# Patient Record
Sex: Female | Born: 1950 | Race: White | Hispanic: No | Marital: Married | Smoking: Never smoker
Health system: Southern US, Community
[De-identification: ages and names within clinical notes are randomized; demographics above are authoritative.]

## PROBLEM LIST (undated history)

## (undated) ENCOUNTER — Inpatient Hospital Stay: Admission: EM | Payer: Self-pay | Source: Home / Self Care

## (undated) DIAGNOSIS — E785 Hyperlipidemia, unspecified: Secondary | ICD-10-CM

## (undated) DIAGNOSIS — I10 Essential (primary) hypertension: Secondary | ICD-10-CM

## (undated) DIAGNOSIS — G8929 Other chronic pain: Secondary | ICD-10-CM

## (undated) HISTORY — PX: SHOULDER ARTHROSCOPY: SHX128

---

## 2002-05-02 ENCOUNTER — Encounter: Admission: RE | Admit: 2002-05-02 | Discharge: 2002-05-13 | Payer: Self-pay | Admitting: Orthopedic Surgery

## 2002-07-04 ENCOUNTER — Encounter: Admission: RE | Admit: 2002-07-04 | Discharge: 2002-10-02 | Payer: Self-pay | Admitting: Orthopedic Surgery

## 2002-10-03 ENCOUNTER — Encounter: Admission: RE | Admit: 2002-10-03 | Discharge: 2002-10-13 | Payer: Self-pay | Admitting: Orthopedic Surgery

## 2002-11-09 ENCOUNTER — Encounter: Payer: Self-pay | Admitting: Emergency Medicine

## 2002-11-09 ENCOUNTER — Emergency Department (HOSPITAL_COMMUNITY): Admission: EM | Admit: 2002-11-09 | Discharge: 2002-11-09 | Payer: Self-pay | Admitting: Emergency Medicine

## 2002-11-10 ENCOUNTER — Ambulatory Visit (HOSPITAL_COMMUNITY): Admission: RE | Admit: 2002-11-10 | Discharge: 2002-11-10 | Payer: Self-pay | Admitting: Unknown Physician Specialty

## 2002-11-10 ENCOUNTER — Encounter: Payer: Self-pay | Admitting: Unknown Physician Specialty

## 2002-12-16 ENCOUNTER — Other Ambulatory Visit: Admission: RE | Admit: 2002-12-16 | Discharge: 2002-12-16 | Payer: Self-pay | Admitting: *Deleted

## 2004-05-17 ENCOUNTER — Ambulatory Visit: Payer: Self-pay | Admitting: Family Medicine

## 2004-12-04 ENCOUNTER — Ambulatory Visit: Payer: Self-pay | Admitting: Family Medicine

## 2004-12-05 ENCOUNTER — Ambulatory Visit (HOSPITAL_COMMUNITY): Admission: RE | Admit: 2004-12-05 | Discharge: 2004-12-05 | Payer: Self-pay | Admitting: Family Medicine

## 2005-05-07 ENCOUNTER — Ambulatory Visit (HOSPITAL_COMMUNITY): Admission: RE | Admit: 2005-05-07 | Discharge: 2005-05-07 | Payer: Self-pay | Admitting: Family Medicine

## 2005-05-19 ENCOUNTER — Ambulatory Visit: Payer: Self-pay | Admitting: Family Medicine

## 2005-07-07 ENCOUNTER — Ambulatory Visit: Payer: Self-pay | Admitting: Family Medicine

## 2005-07-14 ENCOUNTER — Ambulatory Visit: Payer: Self-pay | Admitting: Family Medicine

## 2005-09-19 ENCOUNTER — Ambulatory Visit: Payer: Self-pay | Admitting: Family Medicine

## 2005-09-19 ENCOUNTER — Inpatient Hospital Stay (HOSPITAL_COMMUNITY): Admission: EM | Admit: 2005-09-19 | Discharge: 2005-09-20 | Payer: Self-pay | Admitting: Emergency Medicine

## 2005-11-06 ENCOUNTER — Ambulatory Visit: Payer: Self-pay | Admitting: Family Medicine

## 2006-02-20 ENCOUNTER — Emergency Department (HOSPITAL_COMMUNITY): Admission: EM | Admit: 2006-02-20 | Discharge: 2006-02-20 | Payer: Self-pay | Admitting: Emergency Medicine

## 2006-03-04 ENCOUNTER — Ambulatory Visit: Payer: Self-pay | Admitting: Family Medicine

## 2006-03-04 ENCOUNTER — Ambulatory Visit (HOSPITAL_COMMUNITY): Admission: RE | Admit: 2006-03-04 | Discharge: 2006-03-04 | Payer: Self-pay | Admitting: Family Medicine

## 2006-06-23 ENCOUNTER — Ambulatory Visit: Payer: Self-pay | Admitting: Family Medicine

## 2006-06-26 ENCOUNTER — Ambulatory Visit (HOSPITAL_COMMUNITY): Admission: RE | Admit: 2006-06-26 | Discharge: 2006-06-26 | Payer: Self-pay | Admitting: Family Medicine

## 2006-08-12 ENCOUNTER — Ambulatory Visit: Payer: Self-pay | Admitting: Family Medicine

## 2007-07-21 ENCOUNTER — Ambulatory Visit (HOSPITAL_COMMUNITY): Admission: RE | Admit: 2007-07-21 | Discharge: 2007-07-21 | Payer: Self-pay | Admitting: Family Medicine

## 2008-09-22 ENCOUNTER — Ambulatory Visit (HOSPITAL_COMMUNITY): Admission: RE | Admit: 2008-09-22 | Discharge: 2008-09-22 | Payer: Self-pay | Admitting: Family Medicine

## 2010-04-08 ENCOUNTER — Ambulatory Visit (HOSPITAL_COMMUNITY)
Admission: RE | Admit: 2010-04-08 | Discharge: 2010-04-08 | Payer: Self-pay | Source: Home / Self Care | Admitting: Family Medicine

## 2010-06-02 ENCOUNTER — Encounter: Payer: Self-pay | Admitting: Family Medicine

## 2010-09-27 NOTE — H&P (Signed)
[name], [name]               ACCOUNT NO.:  [id number]   MEDICAL RECORD NO.:  [id number]          PATIENT TYPE:  INP   LOCATION:  A323                          FACILITY:  [facility]   PHYSICIAN:  Margaretmary Dys, M.D.DATE OF BIRTH:  1951/05/09   DATE OF ADMISSION:  09/19/2005  DATE OF DISCHARGE:  05/12/2007LH                                HISTORY & PHYSICAL   ADMISSION DIAGNOSES:  1.  Acute abdominal pain.  2.  Nausea and vomiting.  3.  Dehydration.  4.  Small bowel obstruction with probable early partial small bowel      obstruction.   CHIEF COMPLAINT:  1.  Vomiting.  2.  Abdominal pain.   HISTORY OF PRESENT ILLNESS:  [name] is a 60 year old Caucasian female  who presents with complaint of vomiting.   The patient stated she has had vomiting over the past 2-3 days now.  The  onset had been gradual.  She denies any sick contacts.  She subsequently  developed diffuse abdominal pain, which she rated as 10 out of 10.  The pain  is currently alike and appeared to improve with bowel movements.  The  patient's husband said that two or three other people were sick with the  same symptoms but did not appear to be clearly associated with her.   Evaluation in the emergency room revealed she was mildly dehydrated, and she  also had some distended air bowel loops on x-rays obtained.  The plan is to  admit her for observation overnight.  It appears that this might be entirely  an acute viral gastroenteritis with unlikely subsequent indication for  anything at this time.  The patient said she feels a little bit better ever  since getting IV fluids and pain medication in the emergency room.  She  denies any fever or chills.  No frequency or urgency.  No headaches,  dizziness or lightheadedness.   REVIEW OF SYSTEMS:  A 10-point review of systems is otherwise negative  except as mentioned in the history of present illness.   PAST MEDICAL HISTORY:  1.  Hypertension.  2.   Dyslipidemia.   MEDICATIONS:  1.  She is on Lipitor 20 mg p.o. once a day.  2.  Another blood pressure medication, but she does not remember the name.   ALLERGIES:  She has no known drug allergies.   SOCIAL HISTORY:  The patient is a lifelong nonsmoker.  Does not drink  alcohol.  Denies any drug abuse.  She is married.  Her husband accompanied  her on this visit.   FAMILY HISTORY:  Positive for hypertension, coronary artery disease, but  otherwise noncontributory.   PHYSICAL EXAMINATION:  GENERAL APPEARANCE:  Conscious, alert, comfortable,  not in acute distress, pleasant.  VITAL SIGNS:  Blood pressure on arrival in the emergency room was 136/72,  pulse 67, respirations 18, temperature 97 degrees Fahrenheit.  HEENT:  Normocephalic, atraumatic.  Oral mucosa was moist with no exudates.  NECK:  Supple.  No JVD or lymphadenopathy.  LUNGS:  Clear clinically with good air entry bilaterally.  HEART:  S1, S2, regular.  No S3, S4, gallops or rubs.  ABDOMEN:  Obese, soft, not tender and not distended.  Bowel sounds were  hypoactive.  No masses palpable.  EXTREMITIES:  No edema.  CNS:  Exam was grossly intact with no focal deficits.   LABORATORY/DIAGNOSTIC DATA:  White blood cell count was 7, hemoglobin 13.4,  hematocrit 39.4, platelet count 248 with no left shift.  Sodium 139,  potassium 3.2, chloride 104, CO2 27, glucose 100, BUN 10, creatinine 0.6,  AST 29, ALT 24, calcium 9.  Urinalysis was negative.   Abdominal x-rays showed distended loops of small bowel with probable early  partial SBO.   ASSESSMENT/PLAN:  This is a 60 year old Caucasian female who presented to  the emergency room with nausea, vomiting and abdominal pain.  Symptoms are  fairly consistent with an acute viral illness.  Her abdominal x-rays reveal  some evidence of partial small bowel obstruction, which may be consistent  with her viral gastroenteritis too.  However, the patient needs to be  admitted and observed to  ensure that the small bowel obstruction is not  progressive.  We will keep her n.p.o. for now but may have ice chips, and we  will hydrate her.  We will correct her electrolyte abnormalities.   We will treat her with Zofran p.r.n. for nausea and vomiting.  We will hold  her home medications at this time.   We will review tomorrow and depending on if her symptoms are persistent and  she develops increasing abdominal distention, we will get  repeat abdominal  x-rays.  If symptoms improve, we will possibly discharge her later tomorrow.   I discussed the above plan with her, and she verbalized full understanding.      Margaretmary Dys, M.D.  Electronically Signed     AM/MEDQ  D:  09/20/2005  T:  09/20/2005  Job:  [id number]

## 2010-09-27 NOTE — Discharge Summary (Signed)
NAMEGESENIA, BANTZ               ACCOUNT NO.:  [id number]   MEDICAL RECORD NO.:  [id number]          PATIENT TYPE:  INP   LOCATION:  A323                          FACILITY:  [facility]   PHYSICIAN:  Margaretmary Dys, M.D.DATE OF BIRTH:  August 21, 1950   DATE OF ADMISSION:  09/19/2005  DATE OF DISCHARGE:  05/12/2007LH                                 DISCHARGE SUMMARY   DISCHARGE DIAGNOSES:  1.  Acute gastroenteritis.  2.  Dehydration.   DISCHARGE MEDICATIONS:  The patient is to resume all home medications as  previous.   DIET:  Regular diet.   ACTIVITY:  As tolerated.   FOLLOW-UP:  With Dr. Lysbeth Galas as needed.   SPECIAL INSTRUCTIONS:  I have asked her to return to the ED if nausea or  vomiting becomes persistent.   HOSPITAL COURSE:  Ms. Pesch is a 60 year old Caucasian female who presents  with complaint of vomiting and some abdominal pain.  She started vomiting  about two to three days prior to admission.  Evaluation in the emergency  room revealed she was mildly dehydrated, and she also had some distended  bowel loops on x-rays.  Plan was to observe her overnight.  During the  course of observation, she was kept n.p.o.  When I saw her in the morning,  she was doing much better without any significant abdominal distention or  absent bowel sounds.   She did very well and was subsequently discharged on the next day, as her  nausea, vomiting, and diarrhea had significantly abated.   DISPOSITION:  Discharged home in satisfactory condition.      Margaretmary Dys, M.D.  Electronically Signed     AM/MEDQ  D:  10/26/2005  T:  10/26/2005  Job:  [id number]

## 2011-06-30 ENCOUNTER — Other Ambulatory Visit (HOSPITAL_COMMUNITY): Payer: Self-pay | Admitting: Family Medicine

## 2011-06-30 DIAGNOSIS — Z139 Encounter for screening, unspecified: Secondary | ICD-10-CM

## 2011-07-07 ENCOUNTER — Ambulatory Visit (HOSPITAL_COMMUNITY)
Admission: RE | Admit: 2011-07-07 | Discharge: 2011-07-07 | Disposition: A | Discharge status: Home / Self Care | Payer: BC Managed Care – PPO | Source: Ambulatory Visit | Attending: Family Medicine | Admitting: Family Medicine

## 2011-07-07 DIAGNOSIS — Z1231 Encounter for screening mammogram for malignant neoplasm of breast: Secondary | ICD-10-CM | POA: Insufficient documentation

## 2011-07-07 DIAGNOSIS — Z139 Encounter for screening, unspecified: Secondary | ICD-10-CM

## 2013-03-03 ENCOUNTER — Other Ambulatory Visit (HOSPITAL_COMMUNITY): Payer: Self-pay | Admitting: Family Medicine

## 2013-03-03 DIAGNOSIS — R609 Edema, unspecified: Secondary | ICD-10-CM

## 2013-03-04 ENCOUNTER — Ambulatory Visit (HOSPITAL_COMMUNITY)
Admit: 2013-03-04 | Discharge: 2013-03-04 | Disposition: A | Discharge status: Home / Self Care | Payer: BC Managed Care – PPO | Source: Ambulatory Visit | Attending: Family Medicine | Admitting: Family Medicine

## 2013-03-04 ENCOUNTER — Ambulatory Visit (HOSPITAL_COMMUNITY): Payer: BC Managed Care – PPO

## 2013-03-04 DIAGNOSIS — R609 Edema, unspecified: Secondary | ICD-10-CM

## 2013-03-04 DIAGNOSIS — M79609 Pain in unspecified limb: Secondary | ICD-10-CM | POA: Insufficient documentation

## 2013-03-04 DIAGNOSIS — M7989 Other specified soft tissue disorders: Secondary | ICD-10-CM | POA: Insufficient documentation

## 2014-03-13 ENCOUNTER — Encounter (HOSPITAL_COMMUNITY): Payer: Self-pay | Admitting: *Deleted

## 2014-03-14 ENCOUNTER — Encounter (HOSPITAL_COMMUNITY): Payer: Self-pay | Admitting: *Deleted

## 2014-03-14 ENCOUNTER — Encounter (HOSPITAL_COMMUNITY)
Admission: RE | Disposition: A | Discharge status: Home / Self Care | Payer: Self-pay | Source: Ambulatory Visit | Attending: Orthopedic Surgery

## 2014-03-14 ENCOUNTER — Ambulatory Visit (HOSPITAL_COMMUNITY): Payer: Worker's Compensation | Admitting: Certified Registered"

## 2014-03-14 ENCOUNTER — Observation Stay (HOSPITAL_COMMUNITY)
Admission: RE | Admit: 2014-03-14 | Discharge: 2014-03-16 | Disposition: A | Discharge status: Home / Self Care | Payer: Worker's Compensation | Source: Ambulatory Visit | Attending: Orthopedic Surgery | Admitting: Orthopedic Surgery

## 2014-03-14 DIAGNOSIS — S52509A Unspecified fracture of the lower end of unspecified radius, initial encounter for closed fracture: Secondary | ICD-10-CM | POA: Diagnosis present

## 2014-03-14 DIAGNOSIS — I10 Essential (primary) hypertension: Secondary | ICD-10-CM | POA: Diagnosis not present

## 2014-03-14 DIAGNOSIS — S52571A Other intraarticular fracture of lower end of right radius, initial encounter for closed fracture: Principal | ICD-10-CM | POA: Insufficient documentation

## 2014-03-14 DIAGNOSIS — Z79891 Long term (current) use of opiate analgesic: Secondary | ICD-10-CM | POA: Diagnosis not present

## 2014-03-14 DIAGNOSIS — E785 Hyperlipidemia, unspecified: Secondary | ICD-10-CM | POA: Diagnosis not present

## 2014-03-14 DIAGNOSIS — X58XXXA Exposure to other specified factors, initial encounter: Secondary | ICD-10-CM | POA: Diagnosis not present

## 2014-03-14 DIAGNOSIS — Z79899 Other long term (current) drug therapy: Secondary | ICD-10-CM | POA: Diagnosis not present

## 2014-03-14 HISTORY — PX: ORIF WRIST FRACTURE: SHX2133

## 2014-03-14 HISTORY — DX: Essential (primary) hypertension: I10

## 2014-03-14 HISTORY — DX: Hyperlipidemia, unspecified: E78.5

## 2014-03-14 LAB — CBC
HEMATOCRIT: 38.7 % (ref 36.0–46.0)
Hemoglobin: 13.1 g/dL (ref 12.0–15.0)
MCH: 27.5 pg (ref 26.0–34.0)
MCHC: 33.9 g/dL (ref 30.0–36.0)
MCV: 81.1 fL (ref 78.0–100.0)
Platelets: 86 10*3/uL — ABNORMAL LOW (ref 150–400)
RBC: 4.77 MIL/uL (ref 3.87–5.11)
RDW: 15.1 % (ref 11.5–15.5)
WBC: 4.5 10*3/uL (ref 4.0–10.5)

## 2014-03-14 LAB — BASIC METABOLIC PANEL
Anion gap: 12 (ref 5–15)
BUN: 10 mg/dL (ref 6–23)
CALCIUM: 9 mg/dL (ref 8.4–10.5)
CO2: 26 meq/L (ref 19–32)
CREATININE: 0.43 mg/dL — AB (ref 0.50–1.10)
Chloride: 103 mEq/L (ref 96–112)
GFR calc Af Amer: >90 mL/min (ref >90)
GFR calc non Af Amer: >90 mL/min (ref >90)
GLUCOSE: 97 mg/dL (ref 70–99)
Potassium: 3.8 mEq/L (ref 3.7–5.3)
Sodium: 141 mEq/L (ref 137–147)

## 2014-03-14 SURGERY — OPEN REDUCTION INTERNAL FIXATION (ORIF) WRIST FRACTURE
Anesthesia: General | Site: Wrist | Laterality: Right

## 2014-03-14 MED ORDER — MIDAZOLAM HCL 2 MG/2ML IJ SOLN
INTRAMUSCULAR | Status: AC
  Filled 2014-03-14: qty 2

## 2014-03-14 MED ORDER — CEFAZOLIN SODIUM-DEXTROSE 2-3 GM-% IV SOLR
INTRAVENOUS | Status: AC
  Filled 2014-03-14: qty 50

## 2014-03-14 MED ORDER — ONDANSETRON HCL 4 MG/2ML IJ SOLN
INTRAMUSCULAR | Status: AC
  Filled 2014-03-14: qty 2

## 2014-03-14 MED ORDER — SUFENTANIL CITRATE 50 MCG/ML IV SOLN
INTRAVENOUS | Status: AC
  Filled 2014-03-14: qty 1

## 2014-03-14 MED ORDER — PROPOFOL 10 MG/ML IV BOLUS
INTRAVENOUS | Status: AC
  Filled 2014-03-14: qty 20

## 2014-03-14 MED ORDER — OXYCODONE HCL 5 MG/5ML PO SOLN: 5.0000 mg | Freq: Once | ORAL | Status: DC | PRN

## 2014-03-14 MED ORDER — DEXAMETHASONE SODIUM PHOSPHATE 4 MG/ML IJ SOLN
INTRAMUSCULAR | Status: DC | PRN
  Administered 2014-03-14: 4 mg via INTRAVENOUS

## 2014-03-14 MED ORDER — HYDROMORPHONE HCL 1 MG/ML IJ SOLN: 0.2500 mg | INTRAMUSCULAR | Status: DC | PRN

## 2014-03-14 MED ORDER — MIDAZOLAM HCL 2 MG/2ML IJ SOLN
INTRAMUSCULAR | Status: DC | PRN
  Administered 2014-03-14 (×2): 1 mg via INTRAVENOUS

## 2014-03-14 MED ORDER — LIDOCAINE HCL (CARDIAC) 20 MG/ML IV SOLN
INTRAVENOUS | Status: AC
  Filled 2014-03-14: qty 5

## 2014-03-14 MED ORDER — MEPERIDINE HCL 25 MG/ML IJ SOLN: 6.2500 mg | INTRAMUSCULAR | Status: DC | PRN

## 2014-03-14 MED ORDER — SODIUM CHLORIDE 0.9 % IJ SOLN
INTRAMUSCULAR | Status: AC
  Filled 2014-03-14: qty 10

## 2014-03-14 MED ORDER — CEFAZOLIN SODIUM-DEXTROSE 2-3 GM-% IV SOLR
INTRAVENOUS | Status: DC | PRN
  Administered 2014-03-14: 2 g via INTRAVENOUS

## 2014-03-14 MED ORDER — ONDANSETRON HCL 4 MG/2ML IJ SOLN: 4.0000 mg | Freq: Once | INTRAMUSCULAR | Status: DC | PRN

## 2014-03-14 MED ORDER — FENTANYL CITRATE 0.05 MG/ML IJ SOLN
INTRAMUSCULAR | Status: AC
  Filled 2014-03-14: qty 5

## 2014-03-14 MED ORDER — BUPIVACAINE-EPINEPHRINE (PF) 0.5% -1:200000 IJ SOLN
INTRAMUSCULAR | Status: DC | PRN
  Administered 2014-03-14: 30 mL via PERINEURAL

## 2014-03-14 MED ORDER — 0.9 % SODIUM CHLORIDE (POUR BTL) OPTIME
TOPICAL | Status: DC | PRN
  Administered 2014-03-14: 1000 mL

## 2014-03-14 MED ORDER — LACTATED RINGERS IV SOLN
INTRAVENOUS | Status: DC
  Administered 2014-03-14 (×2): via INTRAVENOUS

## 2014-03-14 MED ORDER — ONDANSETRON HCL 4 MG/2ML IJ SOLN
INTRAMUSCULAR | Status: DC | PRN
  Administered 2014-03-14: 4 mg via INTRAVENOUS

## 2014-03-14 MED ORDER — OXYCODONE HCL 5 MG PO TABS: 5.0000 mg | ORAL_TABLET | Freq: Once | ORAL | Status: DC | PRN

## 2014-03-14 MED ORDER — SUFENTANIL CITRATE 50 MCG/ML IV SOLN
INTRAVENOUS | Status: DC | PRN
  Administered 2014-03-14: 10 ug via INTRAVENOUS

## 2014-03-14 MED ORDER — LIDOCAINE HCL (CARDIAC) 20 MG/ML IV SOLN
INTRAVENOUS | Status: DC | PRN
  Administered 2014-03-14: 50 mg via INTRAVENOUS

## 2014-03-14 MED ORDER — FENTANYL CITRATE 0.05 MG/ML IJ SOLN
INTRAMUSCULAR | Status: DC | PRN
  Administered 2014-03-14 (×2): 50 ug via INTRAVENOUS

## 2014-03-14 MED ORDER — FENTANYL CITRATE 0.05 MG/ML IJ SOLN
INTRAMUSCULAR | Status: AC
  Filled 2014-03-14: qty 2

## 2014-03-14 MED ORDER — DEXAMETHASONE SODIUM PHOSPHATE 4 MG/ML IJ SOLN
INTRAMUSCULAR | Status: AC
  Filled 2014-03-14: qty 1

## 2014-03-14 MED ORDER — PROPOFOL INFUSION 10 MG/ML OPTIME
INTRAVENOUS | Status: DC | PRN
  Administered 2014-03-14: 50 ug/kg/min via INTRAVENOUS

## 2014-03-14 SURGICAL SUPPLY — 60 items
BANDAGE ELASTIC 3 VELCRO ST LF (GAUZE/BANDAGES/DRESSINGS) ×3 IMPLANT
BANDAGE ELASTIC 4 VELCRO ST LF (GAUZE/BANDAGES/DRESSINGS) ×3 IMPLANT
BLADE SURG ROTATE 9660 (MISCELLANEOUS) IMPLANT
BNDG CMPR 9X4 STRL LF SNTH (GAUZE/BANDAGES/DRESSINGS) ×1
BNDG CONFORM 2 STRL LF (GAUZE/BANDAGES/DRESSINGS) ×2 IMPLANT
BNDG CONFORM 3 STRL LF (GAUZE/BANDAGES/DRESSINGS) ×2 IMPLANT
BNDG ESMARK 4X9 LF (GAUZE/BANDAGES/DRESSINGS) ×3 IMPLANT
BNDG GAUZE ELAST 4 BULKY (GAUZE/BANDAGES/DRESSINGS) ×5 IMPLANT
CORDS BIPOLAR (ELECTRODE) ×3 IMPLANT
COVER SURGICAL LIGHT HANDLE (MISCELLANEOUS) ×3 IMPLANT
CUFF TOURNIQUET SINGLE 18IN (TOURNIQUET CUFF) ×3 IMPLANT
CUFF TOURNIQUET SINGLE 24IN (TOURNIQUET CUFF) IMPLANT
DRAIN TLS ROUND 10FR (DRAIN) IMPLANT
DRAPE OEC MINIVIEW 54X84 (DRAPES) IMPLANT
DRAPE SURG 17X23 STRL (DRAPES) ×3 IMPLANT
DRSG EMULSION OIL 3X3 NADH (GAUZE/BANDAGES/DRESSINGS) ×2 IMPLANT
GAUZE SPONGE 4X4 12PLY STRL (GAUZE/BANDAGES/DRESSINGS) ×3 IMPLANT
GAUZE XEROFORM 1X8 LF (GAUZE/BANDAGES/DRESSINGS) ×3 IMPLANT
GLOVE BIOGEL M STRL SZ7.5 (GLOVE) ×3 IMPLANT
GLOVE SS BIOGEL STRL SZ 8 (GLOVE) ×1 IMPLANT
GLOVE SUPERSENSE BIOGEL SZ 8 (GLOVE) ×2
GOWN STRL REUS W/ TWL LRG LVL3 (GOWN DISPOSABLE) ×3 IMPLANT
GOWN STRL REUS W/ TWL XL LVL3 (GOWN DISPOSABLE) ×3 IMPLANT
GOWN STRL REUS W/TWL LRG LVL3 (GOWN DISPOSABLE) ×9
GOWN STRL REUS W/TWL XL LVL3 (GOWN DISPOSABLE) ×9
KIT BASIN OR (CUSTOM PROCEDURE TRAY) ×3 IMPLANT
KIT ROOM TURNOVER OR (KITS) ×3 IMPLANT
LOOP VESSEL MAXI BLUE (MISCELLANEOUS) IMPLANT
MANIFOLD NEPTUNE II (INSTRUMENTS) ×3 IMPLANT
NEEDLE 22X1 1/2 (OR ONLY) (NEEDLE) IMPLANT
NS IRRIG 1000ML POUR BTL (IV SOLUTION) ×3 IMPLANT
PACK ORTHO EXTREMITY (CUSTOM PROCEDURE TRAY) ×3 IMPLANT
PAD ARMBOARD 7.5X6 YLW CONV (MISCELLANEOUS) ×6 IMPLANT
PAD CAST 3X4 CTTN HI CHSV (CAST SUPPLIES) ×1 IMPLANT
PAD CAST 4YDX4 CTTN HI CHSV (CAST SUPPLIES) ×1 IMPLANT
PADDING CAST ABS 3INX4YD NS (CAST SUPPLIES) ×2
PADDING CAST ABS 4INX4YD NS (CAST SUPPLIES) ×2
PADDING CAST ABS COTTON 3X4 (CAST SUPPLIES) IMPLANT
PADDING CAST ABS COTTON 4X4 ST (CAST SUPPLIES) IMPLANT
PADDING CAST COTTON 3X4 STRL (CAST SUPPLIES) ×3
PADDING CAST COTTON 4X4 STRL (CAST SUPPLIES) ×3
PEG LOCKING SMOOTH 2.2X18 (Peg) ×4 IMPLANT
PEG LOCKING SMOOTH 2.2X20 (Screw) ×4 IMPLANT
PEG LOCKING SMOOTH 2.2X22 (Screw) ×4 IMPLANT
PLATE NARROW DVR RIGHT (Plate) ×2 IMPLANT
SCREW LOCKING 2.7X13MM (Screw) ×2 IMPLANT
SCREW LOCKING 2.7X15MM (Screw) ×6 IMPLANT
SPLINT FIBERGLASS 4X30 (CAST SUPPLIES) ×2 IMPLANT
SPONGE LAP 4X18 X RAY DECT (DISPOSABLE) IMPLANT
SUT MNCRL AB 4-0 PS2 18 (SUTURE) ×3 IMPLANT
SUT PROLENE 3 0 PS 2 (SUTURE) IMPLANT
SUT VIC AB 3-0 FS2 27 (SUTURE) IMPLANT
SYR CONTROL 10ML LL (SYRINGE) IMPLANT
SYSTEM CHEST DRAIN TLS 7FR (DRAIN) IMPLANT
TOWEL OR 17X24 6PK STRL BLUE (TOWEL DISPOSABLE) ×3 IMPLANT
TOWEL OR 17X26 10 PK STRL BLUE (TOWEL DISPOSABLE) ×3 IMPLANT
TUBE CONNECTING 12'X1/4 (SUCTIONS) ×1
TUBE CONNECTING 12X1/4 (SUCTIONS) ×2 IMPLANT
TUBE EVACUATION TLS (MISCELLANEOUS) ×3 IMPLANT
WATER STERILE IRR 1000ML POUR (IV SOLUTION) ×3 IMPLANT

## 2014-03-14 NOTE — H&P (Signed)
[name] is an 63 y.o. female.   Chief Complaint: right distal radius fracture patient presents for ORIF HPI: patient presents for ORIF right distal radius fracture  Patient understands all issues risk and benefits  Patient presents for evaluation and treatment of the of their upper extremity predicament. The patient denies neck back chest or of abdominal pain. The patient notes that they have no lower extremity problems. The patient from primarily complains of the upper extremity pain noted.  Past Medical History  Diagnosis Date  . Hypertension   . Hyperlipemia     Past Surgical History  Procedure Laterality Date  . Shoulder arthroscopy Left   . Cesarean section      x2    History reviewed. No pertinent family history. Social History:  reports that she has never smoked. She does not have any smokeless tobacco history on file. She reports that she does not drink alcohol or use illicit drugs.  Allergies:  Allergies  Allergen Reactions  . Hydromorphone Hcl Nausea And Vomiting    Medications Prior to Admission  Medication Sig Dispense Refill  . docusate sodium (COLACE) 100 MG capsule Take 100 mg by mouth 2 (two) times daily.    . hydrochlorothiazide (HYDRODIURIL) 25 MG tablet Take 25 mg by mouth daily.    Marland Kitchen HYDROcodone-acetaminophen (NORCO/VICODIN) 5-325 MG per tablet Take 1 tablet by mouth every 6 (six) hours as needed for moderate pain.    . methocarbamol (ROBAXIN) 500 MG tablet Take 500 mg by mouth every 8 (eight) hours as needed for muscle spasms.    . potassium chloride SA (K-DUR,KLOR-CON) 20 MEQ tablet Take 20 mEq by mouth 2 (two) times daily.    . pravastatin (PRAVACHOL) 20 MG tablet Take 20 mg by mouth daily.    . quinapril (ACCUPRIL) 40 MG tablet Take 40 mg by mouth daily.    . vitamin C (ASCORBIC ACID) 500 MG tablet Take 500 mg by mouth daily.    Marland Kitchen HYDROmorphone (DILAUDID) 2 MG tablet Take 2-4 mg by mouth every 4 (four) hours as needed for severe pain.       Results for orders placed or performed during the hospital encounter of 03/14/14 (from the past 48 hour(s))  Basic metabolic panel     Status: Abnormal   Collection Time: 03/14/14  4:07 PM  Result Value Ref Range   Sodium 141 137 - 147 mEq/L   Potassium 3.8 3.7 - 5.3 mEq/L   Chloride 103 96 - 112 mEq/L   CO2 26 19 - 32 mEq/L   Glucose, Bld 97 70 - 99 mg/dL   BUN 10 6 - 23 mg/dL   Creatinine, Ser 0.43 (L) 0.50 - 1.10 mg/dL   Calcium 9.0 8.4 - 10.5 mg/dL   GFR calc non Af Amer >90 >90 mL/min   GFR calc Af Amer >90 >90 mL/min    Comment: (NOTE) The eGFR has been calculated using the CKD EPI equation. This calculation has not been validated in all clinical situations. eGFR's persistently <90 mL/min signify possible Chronic Kidney Disease.    Anion gap 12 5 - 15  CBC     Status: Abnormal   Collection Time: 03/14/14  4:07 PM  Result Value Ref Range   WBC 4.5 4.0 - 10.5 K/uL   RBC 4.77 3.87 - 5.11 MIL/uL   Hemoglobin 13.1 12.0 - 15.0 g/dL   HCT 38.7 36.0 - 46.0 %   MCV 81.1 78.0 - 100.0 fL   MCH 27.5 26.0 -  34.0 pg   MCHC 33.9 30.0 - 36.0 g/dL   RDW 15.1 11.5 - 15.5 %   Platelets 86 (L) 150 - 400 K/uL    Comment: REPEATED TO VERIFY PLATELET COUNT CONFIRMED BY SMEAR    No results found.  ROS  Blood pressure 145/65, pulse 70, temperature 98.1 F (36.7 C), temperature source Oral, resp. rate 16, height $RemoveBe'5\' 6"'dMHuNZFfU$  (1.676 m), weight 111.131 kg (245 lb), SpO2 97 %. Physical Exam right distal radius fracture displaced. NVI Positive pulse, positive refill  The patient is alert and oriented in no acute distress the patient complains of pain in the affected upper extremity.  The patient is noted to have a normal HEENT exam.  Lung fields show equal chest expansion and no shortness of breath  abdomen exam is nontender without distention.  Lower extremity examination does not show any fracture dislocation or blood clot symptoms.  Pelvis is stable neck and back are stable and  nontender    Assessment/Plan  Plan for open reduction internal fixation right distal radius fracture with repair as necessary patient understands all issues and desires to proceed We are planning surgery for your upper extremity. The risk and benefits of surgery include risk of bleeding infection anesthesia damage to normal structures and failure of the surgery to accomplish its intended goals of relieving symptoms and restoring function with this in mind we'll going to proceed. I have specifically discussed with the patient the pre-and postoperative regime and the does and don'ts and risk and benefits in great detail. Risk and benefits of surgery also include risk of dystrophy chronic nerve pain failure of the healing process to go onto completion and other inherent risks of surgery The relavent the pathophysiology of the disease/injury process, as well as the alternatives for treatment and postoperative course of action has been discussed in great detail with the patient who desires to proceed.  We will do everything in our power to help you (the patient) restore function to the upper extremity. Is a pleasure to see this patient today.   Paulene Floor 03/14/2014, 8:46 PM

## 2014-03-14 NOTE — Anesthesia Preprocedure Evaluation (Signed)
Anesthesia Evaluation  Patient identified by MRN, date of birth, ID band Patient awake    Reviewed: Allergy & Precautions, H&P , NPO status , Patient's Chart, lab work & pertinent test results  Airway Mallampati: I TM Distance: >3 FB Neck ROM: Full    Dental   Pulmonary          Cardiovascular hypertension, Pt. on medications     Neuro/Psych    GI/Hepatic   Endo/Other    Renal/GU      Musculoskeletal   Abdominal   Peds  Hematology   Anesthesia Other Findings   Reproductive/Obstetrics                           Anesthesia Physical Anesthesia Plan  ASA: II  Anesthesia Plan: General   Post-op Pain Management:    Induction: Intravenous  Airway Management Planned: LMA  Additional Equipment:   Intra-op Plan:   Post-operative Plan: Extubation in OR  Informed Consent: I have reviewed the patients History and Physical, chart, labs and discussed the procedure including the risks, benefits and alternatives for the proposed anesthesia with the patient or authorized representative who has indicated his/her understanding and acceptance.     Plan Discussed with: CRNA and Surgeon  Anesthesia Plan Comments:         Anesthesia Quick Evaluation  

## 2014-03-14 NOTE — Anesthesia Procedure Notes (Addendum)
Anesthesia Regional Block:  Supraclavicular block  Pre-Anesthetic Checklist: ,, timeout performed, Correct Patient, Correct Site, Correct Laterality, Correct Procedure, Correct Position, site marked, Risks and benefits discussed,  Surgical consent,  Pre-op evaluation,  At surgeon's request and post-op pain management  Laterality: Right  Prep: chloraprep       Needles:   Needle Type: Echogenic Stimulator Needle     Needle Length: 9cm 9 cm Needle Gauge: 21 and 21 G    Additional Needles:  Procedures: ultrasound guided (picture in chart) Supraclavicular block  Nerve Stimulator or Paresthesia:  Response: 0.4 mA,   Additional Responses:   Narrative:  Start time: 03/14/2014 8:00 PM End time: 03/14/2014 8:10 PM Injection made incrementally with aspirations every 5 mL.  Performed by: Personally   Additional Notes: Monitors applied. Patient sedated. Sterile prep and drape,hand hygiene and sterile gloves were used. Relevant anatomy identified.Needle position confirmed.Local anesthetic injected incrementally after negative aspiration. Local anesthetic spread visualized around nerve(s). Vascular puncture avoided. No complications. Image printed for medical record.The patient tolerated the procedure well.

## 2014-03-14 NOTE — Op Note (Signed)
See dictation #[id number] Amanda PeaGramig MD

## 2014-03-14 NOTE — Transfer of Care (Signed)
Immediate Anesthesia Transfer of Care Note  Patient: [name]  Procedure(s) Performed: Procedure(s): OPEN REDUCTION INTERNAL FIXATION RIGHT WRIST FRACTURE (Right)  Patient Location: PACU  Anesthesia Type:MAC combined with regional for post-op pain  Level of Consciousness: awake, alert  and patient cooperative  Airway & Oxygen Therapy: Patient Spontanous Breathing  Post-op Assessment: Report given to PACU RN, Post -op Vital signs reviewed and stable and Patient moving all extremities X 4  Post vital signs: Reviewed and stable  Complications: No apparent anesthesia complications

## 2014-03-15 ENCOUNTER — Encounter (HOSPITAL_COMMUNITY): Payer: Self-pay | Admitting: Orthopedic Surgery

## 2014-03-15 DIAGNOSIS — S52571A Other intraarticular fracture of lower end of right radius, initial encounter for closed fracture: Secondary | ICD-10-CM | POA: Diagnosis not present

## 2014-03-15 MED ORDER — METHOCARBAMOL 500 MG PO TABS
500.0000 mg | ORAL_TABLET | Freq: Four times a day (QID) | ORAL | Status: DC | PRN
  Filled 2014-03-15: qty 1

## 2014-03-15 MED ORDER — POTASSIUM CHLORIDE CRYS ER 20 MEQ PO TBCR
20.0000 meq | EXTENDED_RELEASE_TABLET | Freq: Two times a day (BID) | ORAL | Status: DC
  Administered 2014-03-15 – 2014-03-16 (×4): 20 meq via ORAL
  Filled 2014-03-15 (×6): qty 1

## 2014-03-15 MED ORDER — FAMOTIDINE 20 MG PO TABS
20.0000 mg | ORAL_TABLET | Freq: Two times a day (BID) | ORAL | Status: DC | PRN
  Filled 2014-03-15: qty 1

## 2014-03-15 MED ORDER — PRAVASTATIN SODIUM 20 MG PO TABS
20.0000 mg | ORAL_TABLET | Freq: Every day | ORAL | Status: DC
  Administered 2014-03-15 – 2014-03-16 (×2): 20 mg via ORAL
  Filled 2014-03-15 (×2): qty 1

## 2014-03-15 MED ORDER — CEFAZOLIN SODIUM 1-5 GM-% IV SOLN
1.0000 g | INTRAVENOUS | Status: AC
  Administered 2014-03-15: 1 g via INTRAVENOUS
  Filled 2014-03-15: qty 50

## 2014-03-15 MED ORDER — ADULT MULTIVITAMIN W/MINERALS CH
1.0000 | ORAL_TABLET | Freq: Every day | ORAL | Status: DC
  Administered 2014-03-15 – 2014-03-16 (×2): 1 via ORAL
  Filled 2014-03-15 (×2): qty 1

## 2014-03-15 MED ORDER — ALPRAZOLAM 0.5 MG PO TABS: 0.5000 mg | ORAL_TABLET | Freq: Four times a day (QID) | ORAL | Status: DC | PRN

## 2014-03-15 MED ORDER — ONDANSETRON HCL 4 MG/2ML IJ SOLN: 4.0000 mg | Freq: Four times a day (QID) | INTRAMUSCULAR | Status: DC | PRN

## 2014-03-15 MED ORDER — SODIUM CHLORIDE 0.45 % IV SOLN: INTRAVENOUS | Status: DC

## 2014-03-15 MED ORDER — OXYCODONE HCL 5 MG PO TABS: 5.0000 mg | ORAL_TABLET | ORAL | Status: DC | PRN

## 2014-03-15 MED ORDER — SENNA 8.6 MG PO TABS
1.0000 | ORAL_TABLET | Freq: Two times a day (BID) | ORAL | Status: DC
  Administered 2014-03-15 – 2014-03-16 (×4): 8.6 mg via ORAL
  Filled 2014-03-15 (×5): qty 1

## 2014-03-15 MED ORDER — DIPHENHYDRAMINE HCL 25 MG PO CAPS: 25.0000 mg | ORAL_CAPSULE | Freq: Four times a day (QID) | ORAL | Status: DC | PRN

## 2014-03-15 MED ORDER — CEFAZOLIN SODIUM 1-5 GM-% IV SOLN
1.0000 g | Freq: Three times a day (TID) | INTRAVENOUS | Status: DC
  Administered 2014-03-15 – 2014-03-16 (×4): 1 g via INTRAVENOUS
  Filled 2014-03-15 (×7): qty 50

## 2014-03-15 MED ORDER — PROMETHAZINE HCL 12.5 MG RE SUPP: 12.5000 mg | Freq: Four times a day (QID) | RECTAL | Status: DC | PRN

## 2014-03-15 MED ORDER — ONDANSETRON HCL 4 MG PO TABS: 4.0000 mg | ORAL_TABLET | Freq: Four times a day (QID) | ORAL | Status: DC | PRN

## 2014-03-15 MED ORDER — QUINAPRIL HCL 10 MG PO TABS
40.0000 mg | ORAL_TABLET | Freq: Every day | ORAL | Status: DC
  Administered 2014-03-15 – 2014-03-16 (×2): 40 mg via ORAL
  Filled 2014-03-15 (×2): qty 4

## 2014-03-15 MED ORDER — HYDROCHLOROTHIAZIDE 25 MG PO TABS
25.0000 mg | ORAL_TABLET | Freq: Every day | ORAL | Status: DC
  Administered 2014-03-15 – 2014-03-16 (×2): 25 mg via ORAL
  Filled 2014-03-15 (×2): qty 1

## 2014-03-15 MED ORDER — MORPHINE SULFATE 2 MG/ML IJ SOLN: 1.0000 mg | INTRAMUSCULAR | Status: DC | PRN

## 2014-03-15 MED ORDER — ONDANSETRON HCL 4 MG PO TABS: 4.0000 mg | ORAL_TABLET | Freq: Three times a day (TID) | ORAL | Status: DC | PRN

## 2014-03-15 MED ORDER — OXYCODONE HCL 5 MG PO TABS
5.0000 mg | ORAL_TABLET | ORAL | Status: DC | PRN
  Administered 2014-03-15: 5 mg via ORAL
  Administered 2014-03-15 – 2014-03-16 (×4): 10 mg via ORAL
  Filled 2014-03-15 (×4): qty 2
  Filled 2014-03-15: qty 1

## 2014-03-15 MED ORDER — METHOCARBAMOL 1000 MG/10ML IJ SOLN: 500.0000 mg | Freq: Four times a day (QID) | INTRAVENOUS | Status: DC | PRN

## 2014-03-15 NOTE — Anesthesia Postprocedure Evaluation (Signed)
Anesthesia Post Note  Patient: [name]  Procedure(s) Performed: Procedure(s) (LRB): OPEN REDUCTION INTERNAL FIXATION RIGHT WRIST FRACTURE (Right)  Anesthesia type: general  Patient location: PACU  Post pain: Pain level controlled  Post assessment: Patient's Cardiovascular Status Stable  Last Vitals:  Filed Vitals:   03/14/14 2351  BP: 130/67  Pulse:   Temp: 36.7 C  Resp: 20    Post vital signs: Reviewed and stable  Level of consciousness: sedated  Complications: No apparent anesthesia complications 

## 2014-03-15 NOTE — Discharge Instructions (Signed)
We recommend that you to take vitamin C 1000 mg a day to promote healing we also recommend that if you require her pain medicine that he take a stool softener to prevent constipation as most pain medicines will have constipation side effects. We recommend either Peri-Colace or Senokot and recommend that you also consider adding MiraLAX to prevent the constipation affects from pain medicine if you are required to use them. These medicines are over the counter and maybe purchased at a local pharmacy. ° ° ° °Keep bandage clean and dry.  Call for any problems.  No smoking.  Criteria for driving a car: you should be off your pain medicine for 7-8 hours, able to drive one handed(confident), thinking clearly and feeling able in your judgement to drive. °Continue elevation as it will decrease swelling.  If instructed by MD move your fingers within the confines of the bandage/splint.  Use ice if instructed by your MD. Call immediately for any sudden loss of feeling in your hand/arm or change in functional abilities of the extremity. ° °

## 2014-03-15 NOTE — Progress Notes (Signed)
PT Cancellation Note  Patient Details Name: [name] MRN: [id number] DOB: 03-13-1951   Cancelled Treatment:    Reason Eval/Treat Not Completed: PT screened, no needs identified, will sign off. Discussed pt case with OT who states pt is modified independent with mobility. No PT needs at this time; will sign off, however please reconsult if needs change.    Conni SlipperKirkman, Kjerstin Abrigo 03/15/2014, 10:26 AM   Conni SlipperLaura Teyanna Thielman, PT, DPT Acute Rehabilitation Services Pager: [phone]

## 2014-03-15 NOTE — Progress Notes (Signed)
Subjective: 1 Day Post-Op Procedure(s) (LRB): OPEN REDUCTION INTERNAL FIXATION RIGHT WRIST FRACTURE (Right) Patient is awake this am, no difficulties through the night. She denies any pain and has yet to use pain medications as the block still having dense effects. Denies nausea/vomiting, tolerating regular diet without difficulties.  Objective: Vital signs in last 24 hours: Temp:  [97.8 F (36.6 C)-98.4 F (36.9 C)] 98.4 F (36.9 C) (11/04 1428) Pulse Rate:  [67-98] 67 (11/04 1428) Resp:  [18-20] 20 (11/04 1428) BP: (120-130)/(50-67) 125/58 mmHg (11/04 1428) SpO2:  [91 %-96 %] 96 % (11/04 1428) Weight:  [118.57 kg (261 lb 6.4 oz)] 118.57 kg (261 lb 6.4 oz) (11/03 2351)  Intake/Output from previous day: 11/03 0701 - 11/04 0700 In: 1560 [P.O.:360; I.V.:1200] Out: 3 [Urine:3] Intake/Output this shift:     Recent Labs  03/14/14 1607  HGB 13.1    Recent Labs  03/14/14 1607  WBC 4.5  RBC 4.77  HCT 38.7  PLT 86*    Recent Labs  03/14/14 1607  NA 141  K 3.8  CL 103  CO2 26  BUN 10  CREATININE 0.43*  GLUCOSE 97  CALCIUM 9.0   No results for input(s): LABPT, INR in the last 72 hours.  Rue: splint clean and dry, drain d/ced, patient still with dense block, no motor/sensation, excellent refill noted .Marland Kitchen.The patient is alert and oriented in no acute distress the patient complains of pain in the affected upper extremity.  The patient is noted to have a normal HEENT exam.  Lung fields show equal chest expansion and no shortness of breath  abdomen exam is nontender without distention.  Lower extremity examination does not show any fracture dislocation or blood clot symptoms.  Pelvis is stable neck and back are stable and nontender  Assessment/Plan: 1 Day Post-Op Procedure(s) (LRB): OPEN REDUCTION INTERNAL FIXATION RIGHT WRIST FRACTURE (Right) Given the continued effects of the block, we have discussed with the patient continued observatory care given our concerns for  rebound pain. We will plan to monitor, treat the pain as it returns, and determine which opiod will be effective given she is unable to tolerate dilaudid and hydrocodone previously was not effective. We will give oxycodone. Plan to continue observation and d/c tonight vs in am.  Huxley Shurley L 03/15/2014, 8:25 PM

## 2014-03-15 NOTE — Op Note (Signed)
NAMSunday [name]:  [name], [name]               ACCOUNT NO.:  [id number]  MEDICAL RECORD NO.:  [id number]  LOCATION:  5W23C                        FACILITY:  [facility]  PHYSICIAN:  Dionne AnoWilliam M. Nadalie Laughner, M.D.DATE OF BIRTH:  09-Sep-1950  DATE OF PROCEDURE:  03/14/2014 DATE OF DISCHARGE:                              OPERATIVE REPORT   PREOPERATIVE DIAGNOSIS:  Right comminuted complex greater than 3-part intra-articular distal radius fracture, right upper extremity.  POSTOPERATIVE DIAGNOSIS:  Right comminuted complex greater than 3-part intra-articular distal radius fracture, right upper extremity.  PROCEDURE: 1. Open reduction and internal fixation, right distal radius fracture     with DVR plate and screw construct, this was greater than 3-part     intra-articular fracture. 2. AP, lateral, and oblique x-ray performed, examined, and interpreted     in the operative theater. 3. Sliding brachioradialis tenotomy.  SURGEON:  Dionne AnoWilliam M. Amanda PeaGramig, M.D.  ASSISTANT:  Karie ChimeraBrian Buchanan, PA-C.  COMPLICATIONS:  None.  ANESTHESIA:  Block with IV sedation.  TOURNIQUET TIME:  Less than an hour.  DRAINS:  One.  INDICATIONS:  Pleasant female, 63 years of age.  Comminuted fracture. She desires to proceed with definitive fixation as recommended.  She understands risks and benefits of bleeding, infection, anesthesia, damage to normal structures, and failure of surgery to accomplish its intended goals of relieving symptoms and restoring function.  With this in mind, she desires to proceed.  The patient underwent transfer to the operative theater.  Time-out was called.  Pre and postop check was complete.  2 g of Ancef given.  Arm was prepped and draped in usual sterile fashion with Betadine scrub and paint x2.  Following this, the patient had isolation of sterile field. Block was in excellent working fashion and just IV sedation was used. She did not require general anesthetic.  Volar radial incision was  made. __________controlled.  After time-out was called, dissection was carried down to the FCR tendon sheath dorsally and palmarly.  Fascia was released.  Compartments were superficially released following this. Carpal canal contents were retracted ulnarly.  Pronator was lifted off the fracture.  The fracture was then reduced.  It was highly comminuted dorsally.  Once this was noted, we then performed provisional reduction and fixation, checked x-rays, and then applied a DVR plate and screw construct to achieve proper alignment in terms of radial height, inclination, and volar tilt.  The patient had sliding brachioradialis tenotomy performed to lessen deforming forces of the radial styloid. AP, lateral, and oblique x-rays were taken after placement of screws and all looked quite well.  This was an ORIF distal radius fracture with DVR plate and screw construct from [employer].  I was able to align her without difficulty and we were pleased with the alignment in terms of height, inclination, and volar tilt.  Pronator was closed, hemostasis secured with tourniquet deflated.  Distal radioulnar joint was examined, looked excellent, stable without instability symptoms.  Following this, we then very carefully and cautiously closed the remainder of the wound.  TLS drain was placed to allow for the egression of fluid postoperatively, and the wound was closed with Prolene.  There were no complications.  Short-arm clamshell splint was applied.  The patient will be monitored in the recovery room.  He has to be on IV antibiotics, general postop observation.  We will proceed according to standard DVR protocol postoperatively.  This was an uneventful surgical intervention, the dorsal comminution was quite significant.  The patient tolerated the procedure well.     Dionne AnoWilliam M. Amanda PeaGramig, M.D.     [facility]WMG/MEDQ  D:  03/14/2014  T:  03/15/2014  Job:  [id number]

## 2014-03-15 NOTE — Anesthesia Postprocedure Evaluation (Signed)
Anesthesia Post Note  Patient: [name] ScullJanice G Rainwater  Procedure(s) Performed: Procedure(s) (LRB): OPEN REDUCTION INTERNAL FIXATION RIGHT WRIST FRACTURE (Right)  Anesthesia type: general  Patient location: PACU  Post pain: Pain level controlled  Post assessment: Patient's Cardiovascular Status Stable  Last Vitals:  Filed Vitals:   03/14/14 2351  BP: 130/67  Pulse:   Temp: 36.7 C  Resp: 20    Post vital signs: Reviewed and stable  Level of consciousness: sedated  Complications: No apparent anesthesia complications

## 2014-03-15 NOTE — Progress Notes (Signed)
UR completed 

## 2014-03-15 NOTE — Evaluation (Signed)
Occupational Therapy Evaluation Patient Details Name: [name] MRN: [id number] DOB: Aug 09, 1950 Today's Date: 03/15/2014    History of Present Illness s/p ORIF R distal radius   Clinical Impression   Pt was independent in ADL/IADL prior to admission.  Requires min assist for bathing and dressing and is modified independent for toileting and mobility.  Instructed pt in ROM of R shoulder, elbow and finger, sling use, and edema management.  Left patient in chair with 3 pillows elevating R UE.  Pt with nerve block still intact.  Husband present and participating.  No further OT needs.    Follow Up Recommendations  No OT follow up    Equipment Recommendations       Recommendations for Other Services       Precautions / Restrictions Precautions Precautions: None Required Braces or Orthoses: Sling      Mobility Bed Mobility Overal bed mobility: Modified Independent             General bed mobility comments: up to L side  Transfers Overall transfer level: Modified independent                    Balance                                            ADL Overall ADL's : Needs assistance/impaired Eating/Feeding: Set up;Sitting (to open packages)   Grooming: Wash/dry hands;Supervision/safety;Standing   Upper Body Bathing: Minimal assitance;Sitting   Lower Body Bathing: Minimal assistance;Sit to/from stand   Upper Body Dressing : Minimal assistance;Sitting   Lower Body Dressing: Minimal assistance;Sit to/from stand   Toilet Transfer: Museum/gallery curatorAmbulation;Regular Toilet;Modified Independent   Toileting- Clothing Manipulation and Hygiene: Sit to/from stand;Modified independent       Functional mobility during ADLs: Modified independent (ambulated in unit pushing IV pole) General ADL Comments: Instructed in edema managment, AROM on uninvolved joints of R UE, one handed techniques for ADL.     Vision                     Perception      Praxis      Pertinent Vitals/Pain Pain Assessment: No/denies pain (nerve block still intact)     Hand Dominance Right   Extremity/Trunk Assessment Upper Extremity Assessment Upper Extremity Assessment: RUE deficits/detail RUE Deficits / Details: Full PROM of shoulder, elbow and fingers, not able to move independently yet due to nerve block   Lower Extremity Assessment Lower Extremity Assessment: Overall WFL for tasks assessed   Cervical / Trunk Assessment Cervical / Trunk Assessment: Normal (hx of back pain)   Communication Communication Communication: No difficulties   Cognition Arousal/Alertness: Awake/alert Behavior During Therapy: WFL for tasks assessed/performed Overall Cognitive Status: Within Functional Limits for tasks assessed                     General Comments       Exercises       Shoulder Instructions      Home Living Family/patient expects to be discharged to:: Private residence Living Arrangements: Spouse/significant other Available Help at Discharge: Family;Available 24 hours/day                                    Prior Functioning/Environment Level of Independence: Independent  Comments: works as a Occupational psychologistsupervisor of school custodians    OT Diagnosis:     OT Problem List:     OT Treatment/Interventions:      OT Goals(Current goals can be found in the care plan section) Acute Rehab OT Goals Patient Stated Goal: return home, regain use of R hand  OT Frequency:     Barriers to D/C:            Co-evaluation              End of Session Nurse Communication: Mobility status (ok for husband to walk with pt, changed socks to beige)  Activity Tolerance: Patient tolerated treatment well Patient left: in chair;with call bell/phone within reach;with family/visitor present   Time: 0920-1000 OT Time Calculation (min): 40 min Charges:  OT General Charges $OT Visit: 1 Procedure OT Evaluation $Initial OT  Evaluation Tier I: 1 Procedure OT Treatments $Self Care/Home Management : 8-22 mins $Therapeutic Activity: 8-22 mins G-Codes: OT G-codes **NOT FOR INPATIENT CLASS** Functional Assessment Tool Used: clinical judgement Functional Limitation: Self care Self Care Current Status ([id number](G8987): At least 1 percent but less than 20 percent impaired, limited or restricted Self Care Goal Status (U0454(G8988): At least 1 percent but less than 20 percent impaired, limited or restricted Self Care Discharge Status [phone]): At least 1 percent but less than 20 percent impaired, limited or restricted  Evern BioMayberry, Blaire Palomino Lynn 03/15/2014, 10:14 AM [phone]

## 2014-03-16 DIAGNOSIS — S52571A Other intraarticular fracture of lower end of right radius, initial encounter for closed fracture: Secondary | ICD-10-CM | POA: Diagnosis not present

## 2014-03-16 NOTE — Care Management Note (Signed)
    Page 1 of 1   03/16/2014     11:48:06 AM CARE MANAGEMENT NOTE 03/16/2014  Patient:  [name],[name]   Account Number:  [id number]  Date Initiated:  03/16/2014  Documentation initiated by:  Letha CapeAYLOR,Anthone Prieur  Subjective/Objective Assessment:   dx r wrist fx  admit as observation     Action/Plan:   Anticipated DC Date:  03/16/2014   Anticipated DC Plan:  HOME/SELF CARE      DC Planning Services  CM consult      Choice offered to / List presented to:             Status of service:  Completed, signed off Medicare Important Message given?  NO (If response is "NO", the following Medicare IM given date fields will be blank) Date Medicare IM given:   Medicare IM given by:   Date Additional Medicare IM given:   Additional Medicare IM given by:    Discharge Disposition:  HOME/SELF CARE  Per UR Regulation:  Reviewed for med. necessity/level of care/duration of stay  If discussed at Long Length of Stay Meetings, dates discussed:    Comments:  03/16/14 1147 Letha Capeeborah Giah Fickett RN, BSN [phone] no needs.

## 2014-03-16 NOTE — Progress Notes (Signed)
NURSING PROGRESS NOTE  [name] ScullJanice G [name] [id number] Discharge Data: 03/16/2014 10:25 AM Attending Provider: Dominica SeverinWilliam Gramig, MD MVH:QIONGE,XBMWUXLPCP:NYLAND,LEONARD [name] MaduroOBERT, MD     [name] ScullJanice G Pulsifer to be D/C'd Home per MD order.  Discussed with the patient the After Visit Summary and all questions fully answered. All IV's discontinued with no bleeding noted. All belongings returned to patient for patient to take home.   Last Vital Signs:  Blood pressure 132/61, pulse 54, temperature 97.9 F (36.6 C), temperature source Oral, resp. rate 20, height 5\' 6"  (1.676 m), weight 118.57 kg (261 lb 6.4 oz), SpO2 94 %.  Discharge Medication List   Medication List    STOP taking these medications        HYDROcodone-acetaminophen 5-325 MG per tablet  Commonly known as:  NORCO/VICODIN     HYDROmorphone 2 MG tablet  Commonly known as:  DILAUDID      TAKE these medications        docusate sodium 100 MG capsule  Commonly known as:  COLACE  Take 100 mg by mouth 2 (two) times daily.     hydrochlorothiazide 25 MG tablet  Commonly known as:  HYDRODIURIL  Take 25 mg by mouth daily.     methocarbamol 500 MG tablet  Commonly known as:  ROBAXIN  Take 500 mg by mouth every 8 (eight) hours as needed for muscle spasms.     ondansetron 4 MG tablet  Commonly known as:  ZOFRAN  Take 1 tablet (4 mg total) by mouth every 8 (eight) hours as needed for nausea.     oxyCODONE 5 MG immediate release tablet  Commonly known as:  Oxy IR/ROXICODONE  Take 1-2 tablets (5-10 mg total) by mouth every 3 (three) hours as needed for moderate pain.     potassium chloride SA 20 MEQ tablet  Commonly known as:  K-DUR,KLOR-CON  Take 20 mEq by mouth 2 (two) times daily.     pravastatin 20 MG tablet  Commonly known as:  PRAVACHOL  Take 20 mg by mouth daily.     quinapril 40 MG tablet  Commonly known as:  ACCUPRIL  Take 40 mg by mouth daily.     vitamin C 500 MG tablet  Commonly known as:  ASCORBIC ACID  Take 500 mg by mouth daily.

## 2014-03-21 ENCOUNTER — Encounter (HOSPITAL_COMMUNITY): Payer: Self-pay | Admitting: Orthopedic Surgery

## 2014-04-03 NOTE — Discharge Summary (Signed)
  Patient has been seen and examined. Patient has pain appropriate to his injury/process. Patient denies new complaints at this present time. I have discussed his care with nursing staff. Patient is appropriate and alert.  We reviewed vital signs and intake output which are stable.  The upper extremity is neurovascularly intact. Refill is normal. There is no signs of compartment syndrome. There is no signs of dystrophy. There is normal sensation.  I have spent a  great deal of time discussing range of motion edema control and other techniques to decrease edema and promote flexion extension of the fingers. Patient understands the importance of elevation range of motion massage and other measures to lessen pain and prevent swelling.  We have discussed with the patient shoulder range of motion to prevent adhesive capsulitis.  The remainder of the examination is normal today without complicating feature.  Drain was removed without difficulty  Patient will be discharged home. Will plan to see the patient back in the off as per discharge instructions (please see discharge instructions).  Patient had an uneventful hospital course. At the time of discharge patient is stable awake alert and oriented in no acute distress. Regular diet will be continued and has been tolerated. Patient will notify should have problems occur. There is no signs of DVT infection or other complication at this juncture.  All questions have been incurred and answered.  Tomoki Lucken MD

## 2016-08-19 ENCOUNTER — Emergency Department (HOSPITAL_COMMUNITY)
Admission: EM | Admit: 2016-08-19 | Discharge: 2016-08-19 | Disposition: A | Discharge status: Home / Self Care | Payer: BC Managed Care – PPO | Attending: Emergency Medicine | Admitting: Emergency Medicine

## 2016-08-19 ENCOUNTER — Emergency Department (HOSPITAL_COMMUNITY): Payer: BC Managed Care – PPO

## 2016-08-19 ENCOUNTER — Other Ambulatory Visit (HOSPITAL_COMMUNITY): Payer: Self-pay | Admitting: Family Medicine

## 2016-08-19 ENCOUNTER — Encounter (HOSPITAL_COMMUNITY): Payer: Self-pay | Admitting: Emergency Medicine

## 2016-08-19 DIAGNOSIS — R109 Unspecified abdominal pain: Secondary | ICD-10-CM

## 2016-08-19 DIAGNOSIS — N939 Abnormal uterine and vaginal bleeding, unspecified: Secondary | ICD-10-CM | POA: Insufficient documentation

## 2016-08-19 DIAGNOSIS — Z79899 Other long term (current) drug therapy: Secondary | ICD-10-CM | POA: Diagnosis not present

## 2016-08-19 DIAGNOSIS — I10 Essential (primary) hypertension: Secondary | ICD-10-CM | POA: Diagnosis not present

## 2016-08-19 DIAGNOSIS — Z87442 Personal history of urinary calculi: Secondary | ICD-10-CM

## 2016-08-19 HISTORY — DX: Other chronic pain: G89.29

## 2016-08-19 LAB — BASIC METABOLIC PANEL
ANION GAP: 7 (ref 5–15)
BUN: 10 mg/dL (ref 6–20)
CO2: 25 mmol/L (ref 22–32)
Calcium: 8.6 mg/dL — ABNORMAL LOW (ref 8.9–10.3)
Chloride: 102 mmol/L (ref 101–111)
Creatinine, Ser: 0.44 mg/dL (ref 0.44–1.00)
GFR calc non Af Amer: >60 mL/min (ref >60)
Glucose, Bld: 116 mg/dL — ABNORMAL HIGH (ref 65–99)
Potassium: 3.7 mmol/L (ref 3.5–5.1)
Sodium: 134 mmol/L — ABNORMAL LOW (ref 135–145)

## 2016-08-19 LAB — CBC WITH DIFFERENTIAL/PLATELET
BASOS ABS: 0 10*3/uL (ref 0.0–0.1)
Basophils Relative: 1 %
EOS ABS: 0.1 10*3/uL (ref 0.0–0.7)
Eosinophils Relative: 2 %
HEMATOCRIT: 39.5 % (ref 36.0–46.0)
HEMOGLOBIN: 13.6 g/dL (ref 12.0–15.0)
Lymphocytes Relative: 22 %
Lymphs Abs: 1 10*3/uL (ref 0.7–4.0)
MCH: 28.8 pg (ref 26.0–34.0)
MCHC: 34.4 g/dL (ref 30.0–36.0)
MCV: 83.7 fL (ref 78.0–100.0)
Monocytes Absolute: 0.6 10*3/uL (ref 0.1–1.0)
Monocytes Relative: 13 %
NEUTROS ABS: 2.9 10*3/uL (ref 1.7–7.7)
NEUTROS PCT: 63 %
Platelets: 90 10*3/uL — ABNORMAL LOW (ref 150–400)
RBC: 4.72 MIL/uL (ref 3.87–5.11)
RDW: 15 % (ref 11.5–15.5)
WBC: 4.6 10*3/uL (ref 4.0–10.5)

## 2016-08-19 NOTE — ED Provider Notes (Signed)
[facility]-EMERGENCY DEPT Provider Note   CSN: [id number] Arrival date & time: 08/19/16  0810     History   Chief Complaint Chief Complaint  Patient presents with  . Vaginal Bleeding    HPI [name] is a 66 y.o. female who presents with vaginal bleeding. She saw her PCP 6 days ago because every time she urinated she saw blood. She was diagnosed with a UTI. UA in the office showed many RBCs and had +nitrites. She was put on Bactrim but was not having dysuria. Culture showed mixed flora but grew <25,000 units. She is also having low back pain across her entire back. She denies hx of kidney stones or UTI. No fever or chills, chest pain, SOB, abdominal pain, N/V/D. She has continued to have bleeding and then saw drops of blood in her underwear. She called her PCP's office who advised her to come to the ED. No abnormal Pap smears. Mother had breast cancer. She is not on blood thinners.    HPI  Past Medical History:  Diagnosis Date  . Chronic pain   . Hyperlipemia   . Hypertension     Patient Active Problem List   Diagnosis Date Noted  . Traumatic closed displaced fracture of distal end of radius 03/14/2014    Past Surgical History:  Procedure Laterality Date  . CESAREAN SECTION     x2  . ORIF WRIST FRACTURE Right 03/14/2014   Procedure: OPEN REDUCTION INTERNAL FIXATION RIGHT WRIST FRACTURE;  Surgeon: Dominica Severin, MD;  Location: [facility] OR;  Service: Orthopedics;  Laterality: Right;  . SHOULDER ARTHROSCOPY Left     OB History    No data available       Home Medications    Prior to Admission medications   Medication Sig Start Date End Date Taking? Authorizing Provider  docusate sodium (COLACE) 100 MG capsule Take 100 mg by mouth 2 (two) times daily.    Historical Provider, MD  hydrochlorothiazide (HYDRODIURIL) 25 MG tablet Take 25 mg by mouth daily.    Historical Provider, MD  methocarbamol (ROBAXIN) 500 MG tablet Take 500 mg by mouth every 8 (eight) hours as needed for  muscle spasms.    Historical Provider, MD  ondansetron (ZOFRAN) 4 MG tablet Take 1 tablet (4 mg total) by mouth every 8 (eight) hours as needed for nausea. 03/15/14   Karie Chimera, PA-C  oxyCODONE (OXY IR/ROXICODONE) 5 MG immediate release tablet Take 1-2 tablets (5-10 mg total) by mouth every 3 (three) hours as needed for moderate pain. 03/15/14   Karie Chimera, PA-C  potassium chloride SA (K-DUR,KLOR-CON) 20 MEQ tablet Take 20 mEq by mouth 2 (two) times daily.    Historical Provider, MD  pravastatin (PRAVACHOL) 20 MG tablet Take 20 mg by mouth daily.    Historical Provider, MD  quinapril (ACCUPRIL) 40 MG tablet Take 40 mg by mouth daily.    Historical Provider, MD  vitamin C (ASCORBIC ACID) 500 MG tablet Take 500 mg by mouth daily.    Historical Provider, MD    Family History History reviewed. No pertinent family history.  Social History Social History  Substance Use Topics  . Smoking status: Never Smoker  . Smokeless tobacco: Never Used  . Alcohol use No     Allergies   Hydromorphone hcl   Review of Systems Review of Systems  Constitutional: Negative for chills and fever.  Respiratory: Negative for shortness of breath.   Cardiovascular: Negative for chest pain.  Gastrointestinal: Negative for abdominal pain,  diarrhea, nausea and vomiting.  Genitourinary: Positive for hematuria and vaginal bleeding. Negative for dysuria, pelvic pain and vaginal discharge.  All other systems reviewed and are negative.    Physical Exam Updated Vital Signs BP (!) 148/79 (BP Location: Right Arm)   Pulse 89   Temp 98.8 F (37.1 C) (Tympanic)   Resp 17   Ht  (1.676 m)   Wt 117 kg   SpO2 (!) 88%   BMI 41.64 kg/m   Physical Exam  Constitutional: She is oriented to person, place, and time. She appears well-developed and well-nourished. No distress.  Obese, calm, cooperative  HENT:  Head: Normocephalic and atraumatic.  Eyes: Conjunctivae are normal. Pupils are equal, round, and  reactive to light. Right eye exhibits no discharge. Left eye exhibits no discharge. No scleral icterus.  Neck: Normal range of motion.  Cardiovascular: Normal rate and regular rhythm.  Exam reveals no gallop and no friction rub.   No murmur heard. Pulmonary/Chest: Effort normal and breath sounds normal. No respiratory distress. She has no wheezes. She has no rales. She exhibits no tenderness.  Abdominal: Soft. Bowel sounds are normal. She exhibits no distension and no mass. There is no tenderness. There is no rebound and no guarding. No hernia.  No CVA tenderness  Genitourinary:  Genitourinary Comments: No inguinal lymphadenopathy or inguinal hernia noted. Skin break down noted in skin folds bilaterally. Normal external genitalia. Moderate pain with speculum insertion. Closed cervical os with normal appearance - no rash or lesions. Dark blood oozing from cervix. Chaperone present during exam.    Musculoskeletal:  Tenderness over sacrum and bilateral posterior hips.   Neurological: She is alert and oriented to person, place, and time.  Skin: Skin is warm and dry.  Psychiatric: She has a normal mood and affect. Her behavior is normal.  Nursing note and vitals reviewed.    ED Treatments / Results  Labs (all labs ordered are listed, but only abnormal results are displayed) Labs Reviewed  CBC WITH DIFFERENTIAL/PLATELET - Abnormal; Notable for the following:       Result Value   Platelets 90 (*)    All other components within normal limits  BASIC METABOLIC PANEL - Abnormal; Notable for the following:    Sodium 134 (*)    Glucose, Bld 116 (*)    Calcium 8.6 (*)    All other components within normal limits  URINALYSIS, ROUTINE W REFLEX MICROSCOPIC    EKG  EKG Interpretation None       Radiology US Transvaginal Non-ob  Result Date: 08/19/2016 CLINICAL DATA:  Possible postmenopausal bleeding EXAM: TRANSABDOMINAL AND TRANSVAGINAL ULTRASOUND OF PELVIS TECHNIQUE: Both transabdominal  and transvaginal ultrasound examinations of the pelvis were performed. Transabdominal technique was performed for global imaging of the pelvis including uterus, ovaries, adnexal regions, and pelvic cul-de-sac. It was necessary to proceed with endovaginal exam following the transabdominal exam to visualize the uterus, endometrium, ovaries and adnexa . COMPARISON:  CT 02/20/2006 FINDINGS: Uterus Measurements: 7.7 x 3.4 x 3.2 cm. No fibroids or other mass visualized. Endometrium Thickness: 9 mm in thickness.  No focal abnormality visualized. Right ovary Measurements: 3.8 x 2.9 x 3.1 cm. 2.5 x 2.0 x 1.6 cm simple appearing cyst noted in the right ovary. Left ovary Measurements: Not visualize no adnexal mass seen. Normal appearance/no adnexal mass. Other findings No abnormal free fluid. IMPRESSION: Endometrium thickened at 9 mm. In the setting of post-menopausal bleeding, endometrial sampling is indicated to exclude carcinoma. If results are benign, sonohysterogram  should be considered for focal lesion work-up. (Ref: Radiological Reasoning: Algorithmic Workup of Abnormal Vaginal Bleeding with Endovaginal Sonography and Sonohysterography. AJR 2008; 161:W96-04) Simple appearing 2.5 cm right ovarian cyst. This is almost certainly benign, but follow up ultrasound is recommended in 1 year according to the Society of Radiologists in Ultrasound2010 Consensus Conference Statement (D Lenis Noon et al. Management of Asymptomatic Ovarian and Other Adnexal Cysts Imaged at [place]: Society of Radiologists in Ultrasound Consensus Conference Statement 2010. Radiology 256 (Sept 2010): 943-954.). Electronically Signed   By: Charlett Nose M.D.   On: 08/19/2016 10:27   US Pelvis Complete  Result Date: 08/19/2016 CLINICAL DATA:  Possible postmenopausal bleeding EXAM: TRANSABDOMINAL AND TRANSVAGINAL ULTRASOUND OF PELVIS TECHNIQUE: Both transabdominal and transvaginal ultrasound examinations of the pelvis were performed. Transabdominal technique  was performed for global imaging of the pelvis including uterus, ovaries, adnexal regions, and pelvic cul-de-sac. It was necessary to proceed with endovaginal exam following the transabdominal exam to visualize the uterus, endometrium, ovaries and adnexa . COMPARISON:  CT 02/20/2006 FINDINGS: Uterus Measurements: 7.7 x 3.4 x 3.2 cm. No fibroids or other mass visualized. Endometrium Thickness: 9 mm in thickness.  No focal abnormality visualized. Right ovary Measurements: 3.8 x 2.9 x 3.1 cm. 2.5 x 2.0 x 1.6 cm simple appearing cyst noted in the right ovary. Left ovary Measurements: Not visualize no adnexal mass seen. Normal appearance/no adnexal mass. Other findings No abnormal free fluid. IMPRESSION: Endometrium thickened at 9 mm. In the setting of post-menopausal bleeding, endometrial sampling is indicated to exclude carcinoma. If results are benign, sonohysterogram should be considered for focal lesion work-up. (Ref: Radiological Reasoning: Algorithmic Workup of Abnormal Vaginal Bleeding with Endovaginal Sonography and Sonohysterography. AJR 2008; 540:J81-19) Simple appearing 2.5 cm right ovarian cyst. This is almost certainly benign, but follow up ultrasound is recommended in 1 year according to the Society of Radiologists in Ultrasound2010 Consensus Conference Statement (D Lenis Noon et al. Management of Asymptomatic Ovarian and Other Adnexal Cysts Imaged at [place]: Society of Radiologists in Ultrasound Consensus Conference Statement 2010. Radiology 256 (Sept 2010): 943-954.). Electronically Signed   By: Charlett Nose M.D.   On: 08/19/2016 10:27    Procedures Procedures (including critical care time)  Medications Ordered in ED Medications - No data to display   Initial Impression / Assessment and Plan / ED Course  I have reviewed the triage vital signs and the nursing notes.  Pertinent labs & imaging results that were available during my care of the patient were reviewed by me and considered in my medical  decision making (see chart for details).  66 year old female with post-menopausal bleeding. Vitals are normal. CBC remarkable for low platelets (90). BMP unremarkable. Pelvic exam remarkable for bleeding and US shows thickened endometrium which is concerning for cancer. Discussed results with patient. Will give referral to OBGYN. Return precautions given.  Final Clinical Impressions(s) / ED Diagnoses   Final diagnoses:  Vaginal bleeding    New Prescriptions New Prescriptions   No medications on file     Bethel Born, PA-C 08/19/16 1050    Pricilla Loveless, MD 08/22/16 [phone]

## 2016-08-19 NOTE — Discharge Instructions (Signed)
Please follow up with OBGYN as soon as possible for biopsy

## 2016-08-19 NOTE — ED Triage Notes (Signed)
Pt had blood with urination x 8 days ago, almost finished abx. States heavy bleeding started 2 days ago without urinating and unsure if it is from her vagina or not. Color wnl. nad.

## 2016-08-29 ENCOUNTER — Encounter: Payer: Self-pay | Admitting: Obstetrics & Gynecology

## 2016-09-08 ENCOUNTER — Other Ambulatory Visit (HOSPITAL_COMMUNITY): Payer: Self-pay | Admitting: Family Medicine

## 2016-09-08 DIAGNOSIS — Z1231 Encounter for screening mammogram for malignant neoplasm of breast: Secondary | ICD-10-CM

## 2016-09-11 ENCOUNTER — Ambulatory Visit (HOSPITAL_COMMUNITY)
Admission: RE | Admit: 2016-09-11 | Discharge: 2016-09-11 | Disposition: A | Discharge status: Home / Self Care | Payer: BC Managed Care – PPO | Source: Ambulatory Visit | Attending: Family Medicine | Admitting: Family Medicine

## 2016-09-11 DIAGNOSIS — Z1231 Encounter for screening mammogram for malignant neoplasm of breast: Secondary | ICD-10-CM | POA: Insufficient documentation

## 2016-12-17 ENCOUNTER — Ambulatory Visit (HOSPITAL_COMMUNITY)
Admission: RE | Admit: 2016-12-17 | Discharge: 2016-12-17 | Disposition: A | Discharge status: Home / Self Care | Payer: BC Managed Care – PPO | Source: Ambulatory Visit | Attending: Family Medicine | Admitting: Family Medicine

## 2016-12-17 ENCOUNTER — Other Ambulatory Visit (HOSPITAL_COMMUNITY): Payer: Self-pay | Admitting: Family Medicine

## 2016-12-17 DIAGNOSIS — M545 Low back pain: Secondary | ICD-10-CM

## 2016-12-17 DIAGNOSIS — M5136 Other intervertebral disc degeneration, lumbar region: Secondary | ICD-10-CM | POA: Insufficient documentation

## 2016-12-17 DIAGNOSIS — M8588 Other specified disorders of bone density and structure, other site: Secondary | ICD-10-CM | POA: Insufficient documentation

## 2016-12-17 DIAGNOSIS — M4186 Other forms of scoliosis, lumbar region: Secondary | ICD-10-CM | POA: Insufficient documentation

## 2017-05-17 MED ORDER — MORPHINE SULFATE-NACL 50-0.9 MG/50ML-% IV SOLN
1.00 | INTRAVENOUS | Status: DC
Start: ? — End: 2017-05-17

## 2017-05-17 MED ORDER — SODIUM CHLORIDE 0.9 % IJ SOLN
20.00 | INTRAMUSCULAR | Status: DC
Start: 2017-05-18 — End: 2017-05-17

## 2017-05-17 MED ORDER — GENERIC EXTERNAL MEDICATION
2.00 | Status: DC
Start: ? — End: 2017-05-17

## 2017-05-17 MED ORDER — HALOPERIDOL LACTATE 5 MG/ML IJ SOLN
2.00 | INTRAMUSCULAR | Status: DC
Start: ? — End: 2017-05-17

## 2017-05-17 MED ORDER — GLYCOPYRROLATE 0.2 MG/ML IJ SOLN
0.40 | INTRAMUSCULAR | Status: DC
Start: 2017-05-17 — End: 2017-05-17

## 2017-05-17 MED ORDER — GENERIC EXTERNAL MEDICATION
6.25 | Status: DC
Start: ? — End: 2017-05-17

## 2017-05-17 MED ORDER — OXYCODONE HCL 5 MG PO TABS
5.00 | ORAL_TABLET | ORAL | Status: DC
Start: ? — End: 2017-05-17

## 2017-06-12 DEATH — deceased

## 2018-04-07 IMAGING — US US PELVIS COMPLETE
1 series · 13 of 25 positions shown · non-contrast
Comparison: CT 02/20/2006

CLINICAL DATA: Possible postmenopausal bleeding

EXAM:
TRANSABDOMINAL AND TRANSVAGINAL ULTRASOUND OF PELVIS
TECHNIQUE: Both transabdominal and transvaginal ultrasound examinations of the
pelvis were performed. Transabdominal technique was performed for
global imaging of the pelvis including uterus, ovaries, adnexal
regions, and pelvic cul-de-sac. It was necessary to proceed with
endovaginal exam following the transabdominal exam to visualize the
uterus, endometrium, ovaries and adnexa .

[Series 1: us pelvis complete · 0.24mm/px · 13 of 54 slices shown]
[im 1/54]
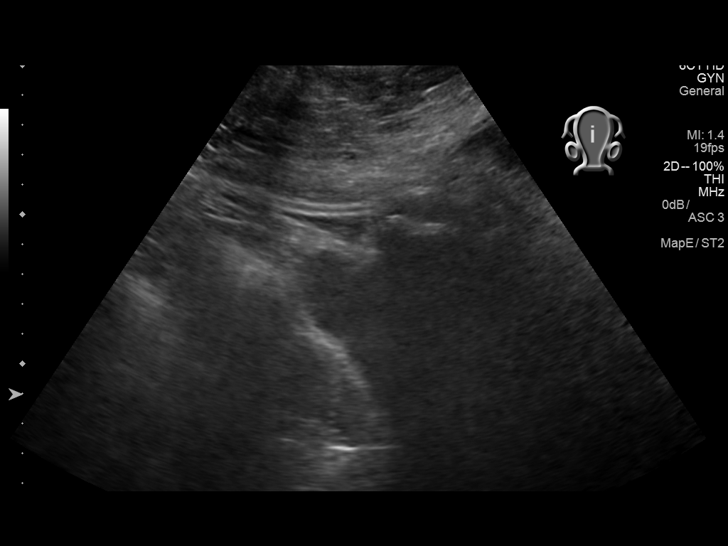
[im 5/54]
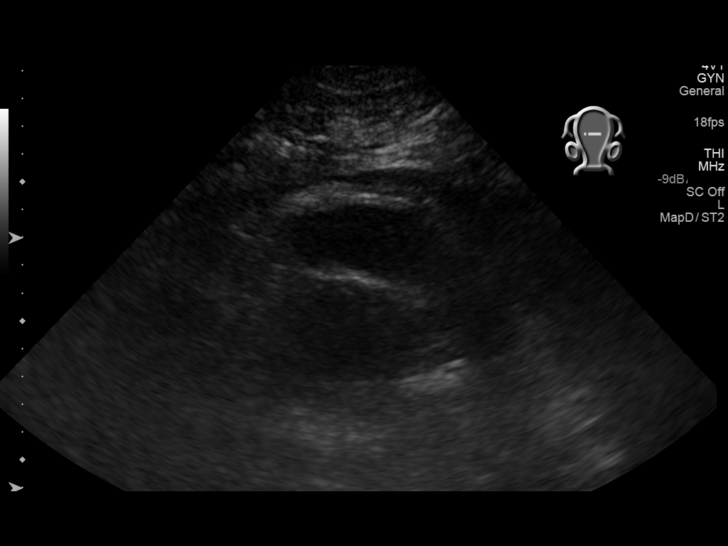
[im 9/54]
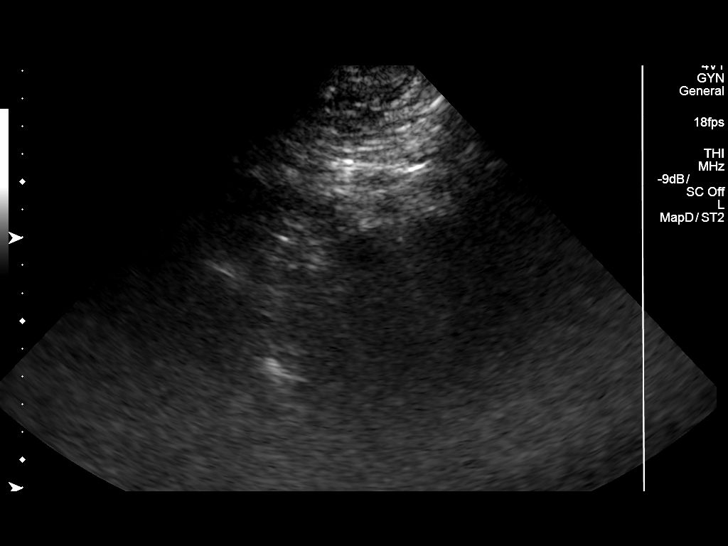
[im 14/54]
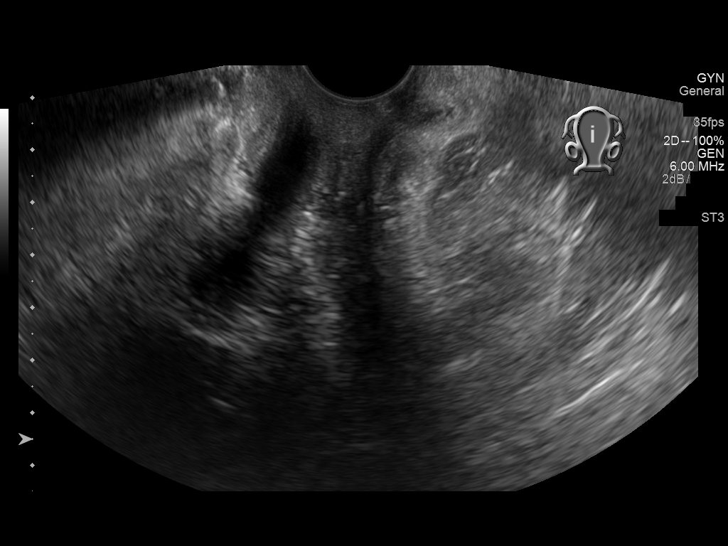
[im 18/54]
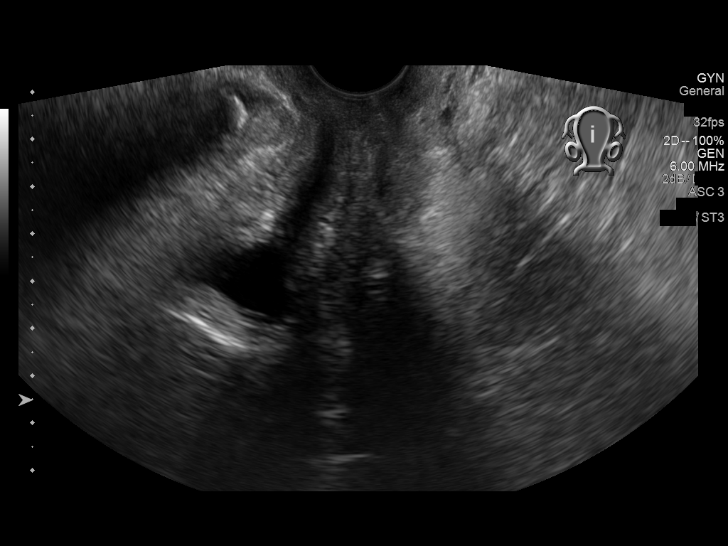
[im 23/54]
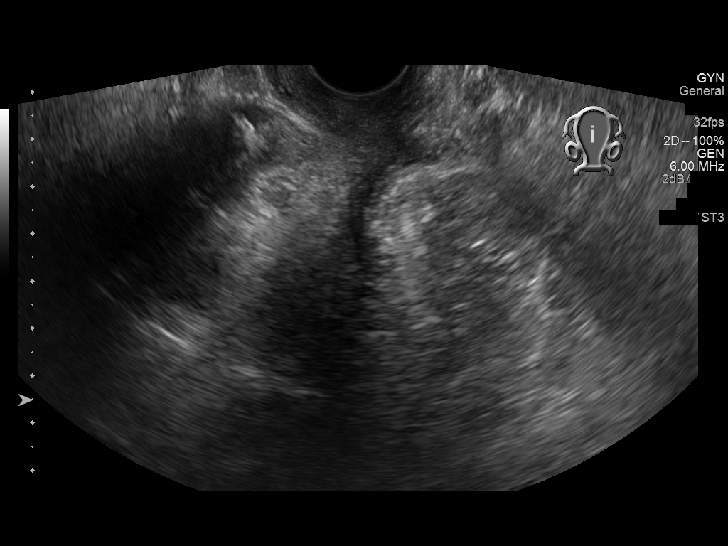
[im 27/54]
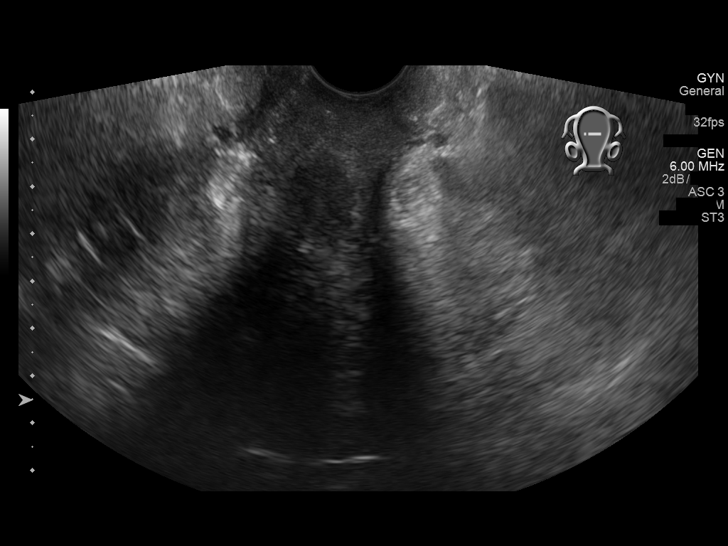
[im 31/54]
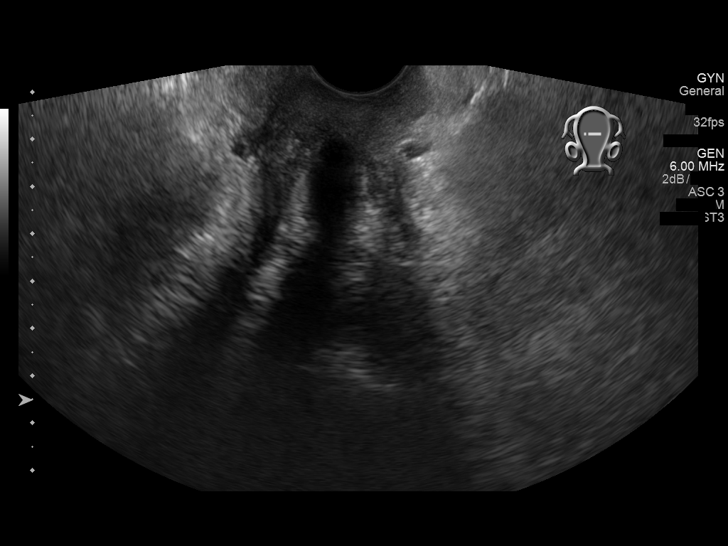
[im 36/54]
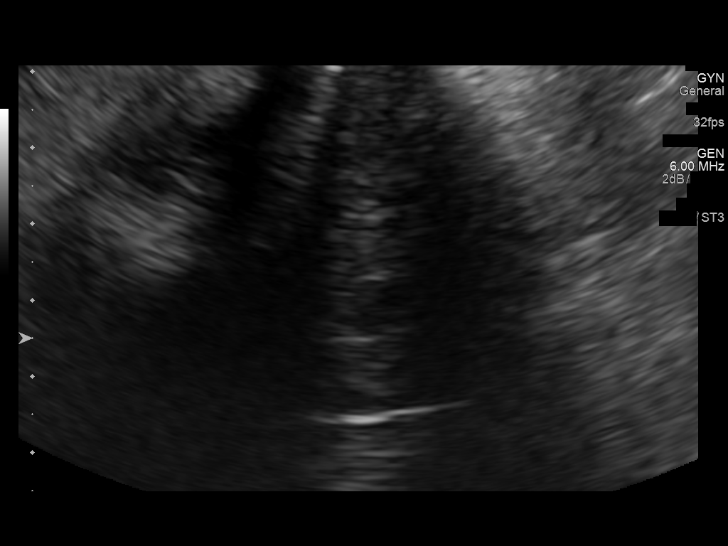
[im 40/54]
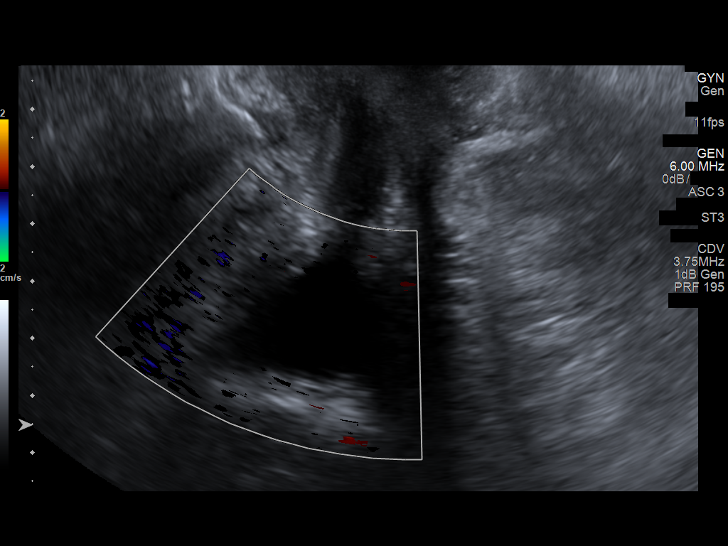
[im 45/54]
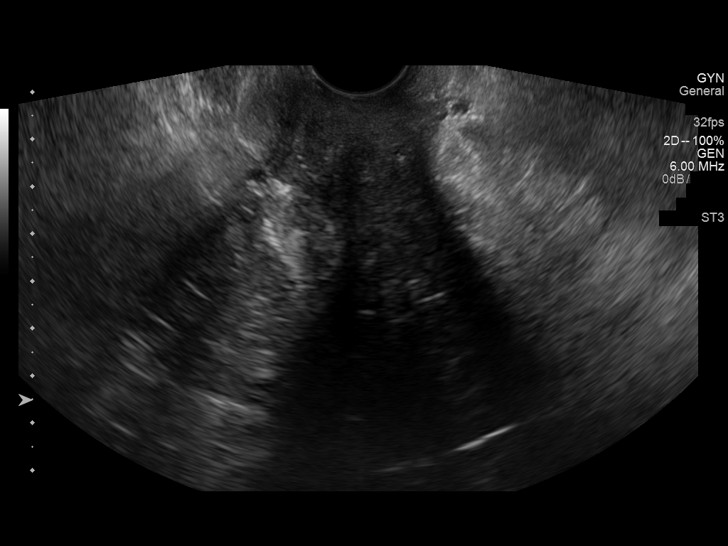
[im 49/54]
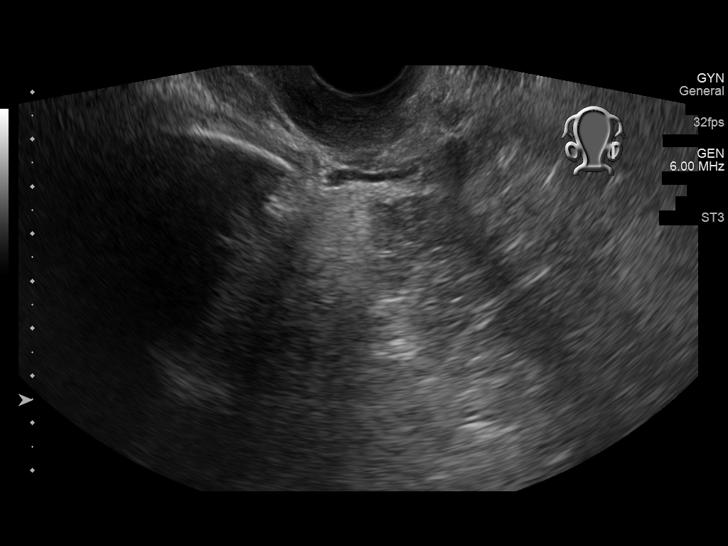
[im 54/54]
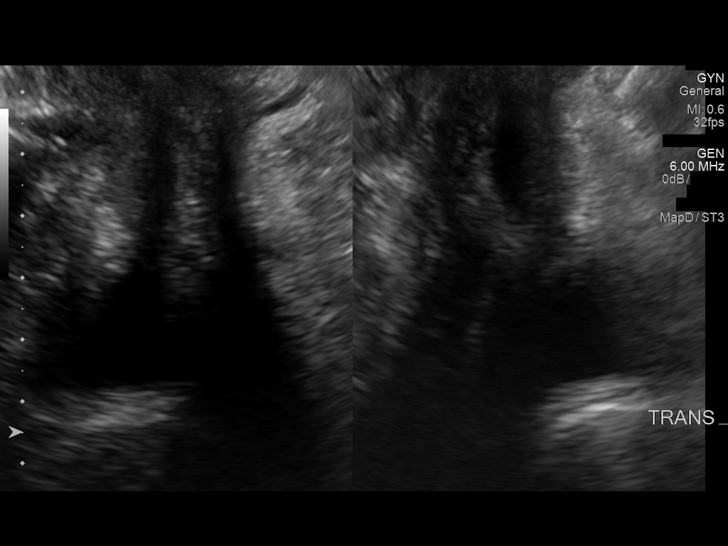

[13 of 25 positions shown; findings below may reference images not displayed]

FINDINGS: Uterus

Measurements: 7.7 x 3.4 x 3.2 cm. No fibroids or other mass
visualized.

Endometrium

Thickness: 9 mm in thickness.  No focal abnormality visualized.

Right ovary

Measurements: 3.8 x 2.9 x 3.1 cm. 2.5 x 2.0 x 1.6 cm simple
appearing cyst noted in the right ovary.

Left ovary

Measurements: Not visualize no adnexal mass seen.. Normal
appearance/no adnexal mass.

Other findings

No abnormal free fluid.
IMPRESSION: Endometrium thickened at 9 mm. In the setting of post-menopausal
bleeding, endometrial sampling is indicated to exclude carcinoma. If
results are benign, sonohysterogram should be considered for focal
lesion work-up. (Ref: Radiological Reasoning: Algorithmic Workup of
Abnormal Vaginal Bleeding with Endovaginal Sonography and
Sonohysterography. AJR 2007; 191:S68-73)

Simple appearing 2.5 cm right ovarian cyst. This is almost certainly
benign, but follow up ultrasound is recommended in 1 year according
to the Society of Radiologists in 3ltrasound4232 Consensus
Conference Statement (Iveth Corr et al. Management of Asymptomatic
Ovarian and Other Adnexal Cysts Imaged at [place]: Society of
Radiologists in Ultrasound Consensus Conference Statement 9616.
Radiology [DATE]): [id number].).
